# Patient Record
Sex: Male | Born: 1995 | Race: Black or African American | Hispanic: No | Marital: Single | Smoking: Never smoker
Health system: Southern US, Community
[De-identification: ages and names within clinical notes are randomized; demographics above are authoritative.]

---

## 2019-07-22 ENCOUNTER — Ambulatory Visit: Payer: Self-pay | Attending: Internal Medicine

## 2020-05-24 ENCOUNTER — Emergency Department (HOSPITAL_COMMUNITY)
Admission: EM | Admit: 2020-05-24 | Discharge: 2020-05-25 | Disposition: A | Discharge status: Home / Self Care | Payer: Medicaid Other | Attending: Emergency Medicine | Admitting: Emergency Medicine

## 2020-05-24 ENCOUNTER — Encounter (HOSPITAL_COMMUNITY): Payer: Self-pay

## 2020-05-24 ENCOUNTER — Other Ambulatory Visit: Payer: Self-pay

## 2020-05-24 DIAGNOSIS — R1011 Right upper quadrant pain: Secondary | ICD-10-CM | POA: Diagnosis not present

## 2020-05-24 NOTE — ED Triage Notes (Signed)
Pt reports RUQ abdominal pain that started tonight. Pt continues saying the pain is not bad but it made it difficult sleeping. Deniesn/v/d.

## 2020-05-25 ENCOUNTER — Emergency Department (HOSPITAL_COMMUNITY): Payer: Medicaid Other

## 2020-05-25 LAB — CBC WITH DIFFERENTIAL/PLATELET
Abs Immature Granulocytes: 0.02 10*3/uL (ref 0.00–0.07)
Basophils Absolute: 0.1 10*3/uL (ref 0.0–0.1)
Basophils Relative: 1 %
Eosinophils Absolute: 0.2 10*3/uL (ref 0.0–0.5)
Eosinophils Relative: 2 %
HCT: 49.3 % (ref 39.0–52.0)
Hemoglobin: 15.7 g/dL (ref 13.0–17.0)
Immature Granulocytes: 0 %
Lymphocytes Relative: 30 %
Lymphs Abs: 2.6 10*3/uL (ref 0.7–4.0)
MCH: 26.3 pg (ref 26.0–34.0)
MCHC: 31.8 g/dL (ref 30.0–36.0)
MCV: 82.7 fL (ref 80.0–100.0)
Monocytes Absolute: 1 10*3/uL (ref 0.1–1.0)
Monocytes Relative: 11 %
Neutro Abs: 4.7 10*3/uL (ref 1.7–7.7)
Neutrophils Relative %: 56 %
Platelets: 203 10*3/uL (ref 150–400)
RBC: 5.96 MIL/uL — ABNORMAL HIGH (ref 4.22–5.81)
RDW: 13.2 % (ref 11.5–15.5)
WBC: 8.5 10*3/uL (ref 4.0–10.5)
nRBC: 0 % (ref 0.0–0.2)

## 2020-05-25 LAB — COMPREHENSIVE METABOLIC PANEL
ALT: 35 U/L (ref 0–44)
AST: 26 U/L (ref 15–41)
Albumin: 4.5 g/dL (ref 3.5–5.0)
Alkaline Phosphatase: 55 U/L (ref 38–126)
Anion gap: 8 (ref 5–15)
BUN: 9 mg/dL (ref 6–20)
CO2: 28 mmol/L (ref 22–32)
Calcium: 10.1 mg/dL (ref 8.9–10.3)
Chloride: 100 mmol/L (ref 98–111)
Creatinine, Ser: 1.13 mg/dL (ref 0.61–1.24)
GFR, Estimated: >60 mL/min (ref >60)
Glucose, Bld: 99 mg/dL (ref 70–99)
Potassium: 3.8 mmol/L (ref 3.5–5.1)
Sodium: 136 mmol/L (ref 135–145)
Total Bilirubin: 0.6 mg/dL (ref 0.3–1.2)
Total Protein: 8.1 g/dL (ref 6.5–8.1)

## 2020-05-25 LAB — URINALYSIS, ROUTINE W REFLEX MICROSCOPIC
Bilirubin Urine: NEGATIVE
Glucose, UA: NEGATIVE mg/dL
Hgb urine dipstick: NEGATIVE
Ketones, ur: NEGATIVE mg/dL
Leukocytes,Ua: NEGATIVE
Nitrite: NEGATIVE
Protein, ur: NEGATIVE mg/dL
Specific Gravity, Urine: 1.008 (ref 1.005–1.030)
pH: 6 (ref 5.0–8.0)

## 2020-05-25 LAB — LIPASE, BLOOD: Lipase: 22 U/L (ref 11–51)

## 2020-05-25 MED ORDER — POLYETHYLENE GLYCOL 3350 17 GM/SCOOP PO POWD: 17.0000 g | Freq: Every day | ORAL | 0 refills | Status: AC

## 2020-05-25 NOTE — Discharge Instructions (Signed)
We recommend Tylenol or ibuprofen for management of persistent pain.  You have been started on MiraLAX to promote regular bowel movements.  Follow-up with your primary care doctor for further evaluation of your symptoms.  Return if symptoms persist or worsen.

## 2020-05-25 NOTE — ED Provider Notes (Signed)
[facility]-EMERGENCY DEPT Provider Note   CSN: [id number] Arrival date & time: 05/24/20  2345     History Chief Complaint  Patient presents with  . Abdominal Pain    Johnny Orozco is a 25 y.o. male.  25 year old male presents to the emergency department for evaluation of abdominal pain.  Pain has been intermittent over the past 24 hours.  Unable to say when the pain began, but reports that he did not have the pain yesterday.  Pain is in his right upper quadrant.  It is nonradiating.  No known modifying factors.  He has tried some gas tablets and Pepto-Bismol for symptoms without relief.  Had a bowel movement earlier today, but reports history of constipation.  Denies associated fevers, nausea, vomiting, diarrhea, urinary symptoms, history of abdominal surgeries.  The history is provided by the patient. No language interpreter was used.  Abdominal Pain      History reviewed. No pertinent past medical history.  There are no problems to display for this patient.   History reviewed. No pertinent surgical history.     No family history on file.  Social History   Tobacco Use  . Smoking status: Never Smoker  . Smokeless tobacco: Never Used    Home Medications Prior to Admission medications   Medication Sig Start Date End Date Taking? Authorizing Provider  polyethylene glycol powder (GLYCOLAX/MIRALAX) 17 GM/SCOOP powder Take 17 g by mouth daily. Until daily soft stools  OTC 05/25/20  Yes Antony Madura, PA-C    Allergies    Risperidone  Review of Systems   Review of Systems  Gastrointestinal: Positive for abdominal pain.  Ten systems reviewed and are negative for acute change, except as noted in the HPI.    Physical Exam Updated Vital Signs BP (!) 127/115 (BP Location: Left Arm)   Pulse 98   Temp 97.7 F (36.5 C) (Oral)   Resp 16   SpO2 100%   Physical Exam Vitals and nursing note reviewed.  Constitutional:      General: He is not in  acute distress.    Appearance: He is well-developed. He is not diaphoretic.     Comments: Nontoxic appearing and in NAD  HENT:     Head: Normocephalic and atraumatic.  Eyes:     General: No scleral icterus.    Conjunctiva/sclera: Conjunctivae normal.  Cardiovascular:     Rate and Rhythm: Normal rate and regular rhythm.     Pulses: Normal pulses.  Pulmonary:     Effort: Pulmonary effort is normal. No respiratory distress.     Breath sounds: No stridor. No wheezing.     Comments: Respirations even and unlabored Abdominal:     Palpations: There is no mass.     Tenderness: There is no abdominal tenderness. There is no guarding.     Comments: Soft, nondistended, nontender abdomen.  No guarding or peritoneal signs.  Musculoskeletal:        General: Normal range of motion.     Cervical back: Normal range of motion.  Skin:    General: Skin is warm and dry.     Coloration: Skin is not pale.     Findings: No erythema or rash.  Neurological:     Mental Status: He is alert and oriented to person, place, and time.     Coordination: Coordination normal.  Psychiatric:        Behavior: Behavior normal.     ED Results / Procedures / Treatments   Labs (  all labs ordered are listed, but only abnormal results are displayed) Labs Reviewed  CBC WITH DIFFERENTIAL/PLATELET - Abnormal; Notable for the following components:      Result Value   RBC 5.96 (*)    All other components within normal limits  URINALYSIS, ROUTINE W REFLEX MICROSCOPIC - Abnormal; Notable for the following components:   Color, Urine STRAW (*)    All other components within normal limits  COMPREHENSIVE METABOLIC PANEL  LIPASE, BLOOD    EKG None  Radiology DG ABD ACUTE 2+V W 1V CHEST  Result Date: 05/25/2020 CLINICAL DATA:  Right upper quadrant pain EXAM: DG ABDOMEN ACUTE WITH 1 VIEW CHEST COMPARISON:  None. FINDINGS: There is no evidence of dilated bowel loops or free intraperitoneal air. No radiopaque calculi or  other significant radiographic abnormality is seen. Heart size and mediastinal contours are within normal limits. Both lungs are clear. IMPRESSION: Negative abdominal radiographs.  No acute cardiopulmonary disease. Electronically Signed   By: Jonna Clark M.D.   On: 05/25/2020 01:16    Procedures Procedures   Medications Ordered in ED Medications - No data to display  ED Course  I have reviewed the triage vital signs and the nursing notes.  Pertinent labs & imaging results that were available during my care of the patient were reviewed by me and considered in my medical decision making (see chart for details).  Clinical Course as of 05/26/20 6546  Mon May 25, 2020  0128 Labs and Xray negative and without changes to suggest acute or emergent process. [KH]  0217 Patient resting comfortably on repeat exam.  Denies any pain at present.  Repeat abdominal assessment without tenderness, distention, peritoneal signs.  Given reassuring labs and imaging, will proceed with outpatient primary care follow-up. [KH]    Clinical Course User Index [KH] Darylene Price   MDM Rules/Calculators/A&P                          25 year old male presents to the emergency department for evaluation of right upper quadrant abdominal pain.  Despite pain complaints, is asymptomatic during assessment.  Initial and repeat abdominal exam without tenderness to palpation.  Labs and x-ray have been reassuring.  No leukocytosis, fever to suggest infectious etiology.  I have reviewed the patient's x-ray myself which does not suggest underlying constipation; however, patient self reports a history of this.  Will provide course of MiraLAX to promote regular bowel movements.  He has been advised to follow-up with his primary care doctor for further evaluation of ongoing or recurrent pain.  Return precautions discussed and provided. Patient discharged in stable condition with no unaddressed concerns.   Final Clinical  Impression(s) / ED Diagnoses Final diagnoses:  Right upper quadrant abdominal pain    Rx / DC Orders ED Discharge Orders         Ordered    polyethylene glycol powder (GLYCOLAX/MIRALAX) 17 GM/SCOOP powder  Daily        05/25/20 0217           Antony Madura, PA-C 05/26/20 5035    Glynn Octave, MD 05/26/20 [id number]

## 2022-02-24 IMAGING — CR DG ABDOMEN ACUTE W/ 1V CHEST
4 series · 4 of 4 positions shown · non-contrast
Comparison: None.

CLINICAL DATA: Right upper quadrant pain

EXAM:
DG ABDOMEN ACUTE WITH 1 VIEW CHEST

[w chest pa]
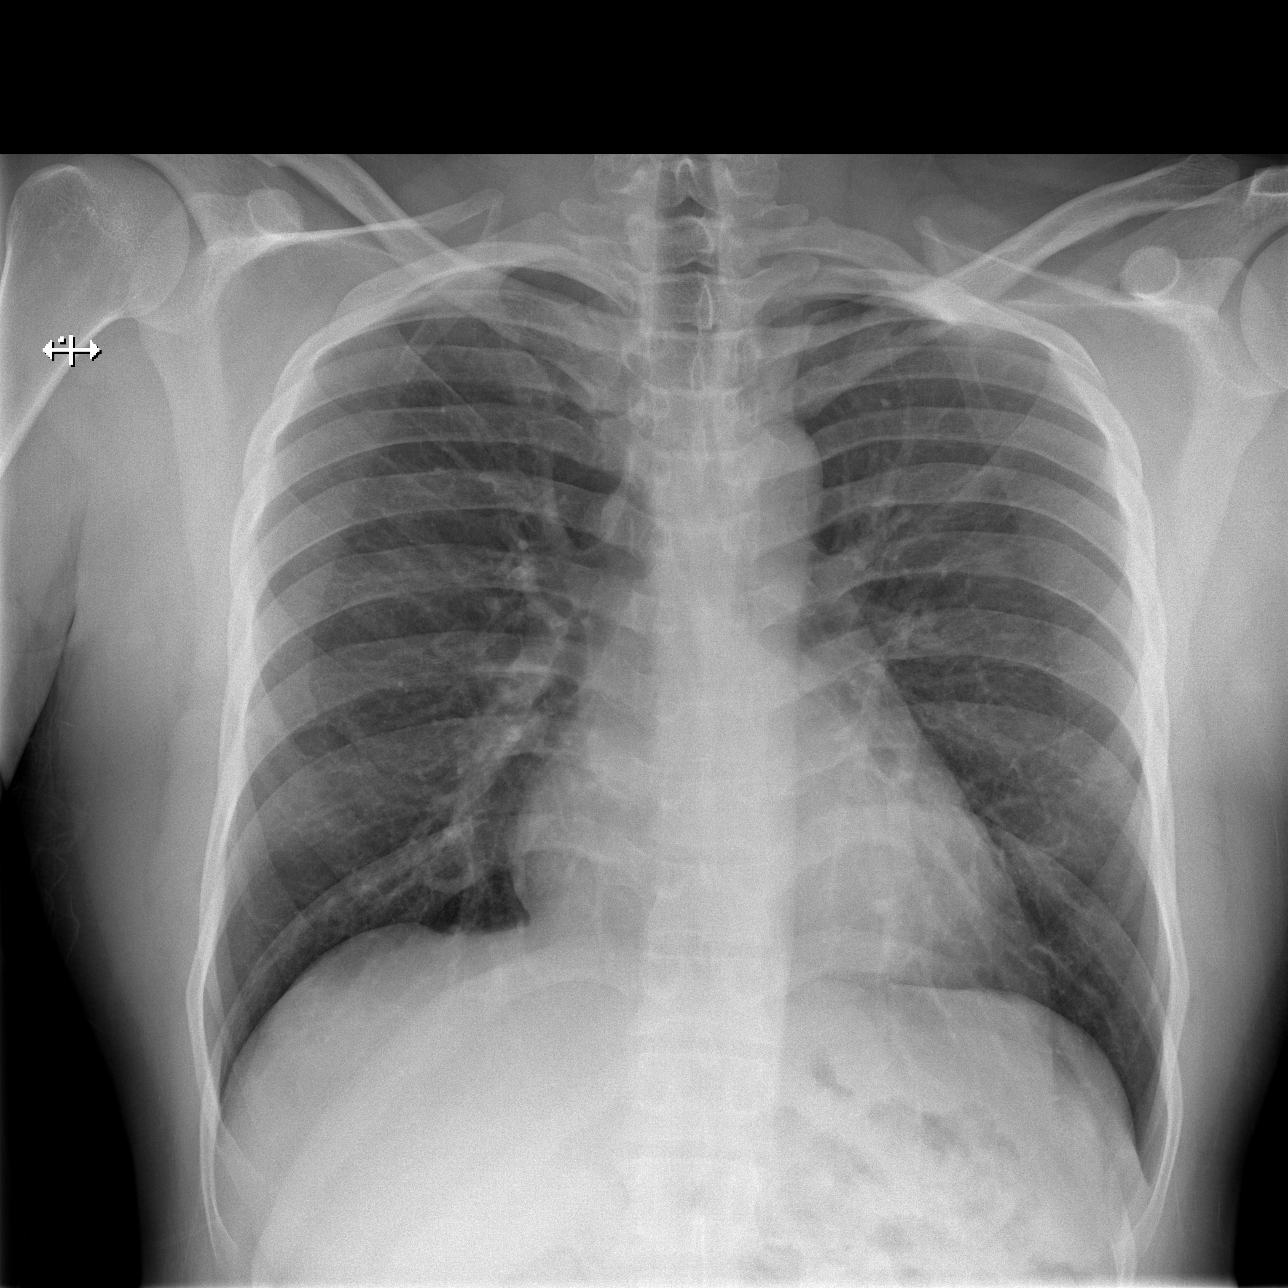

[w abdomen upright]
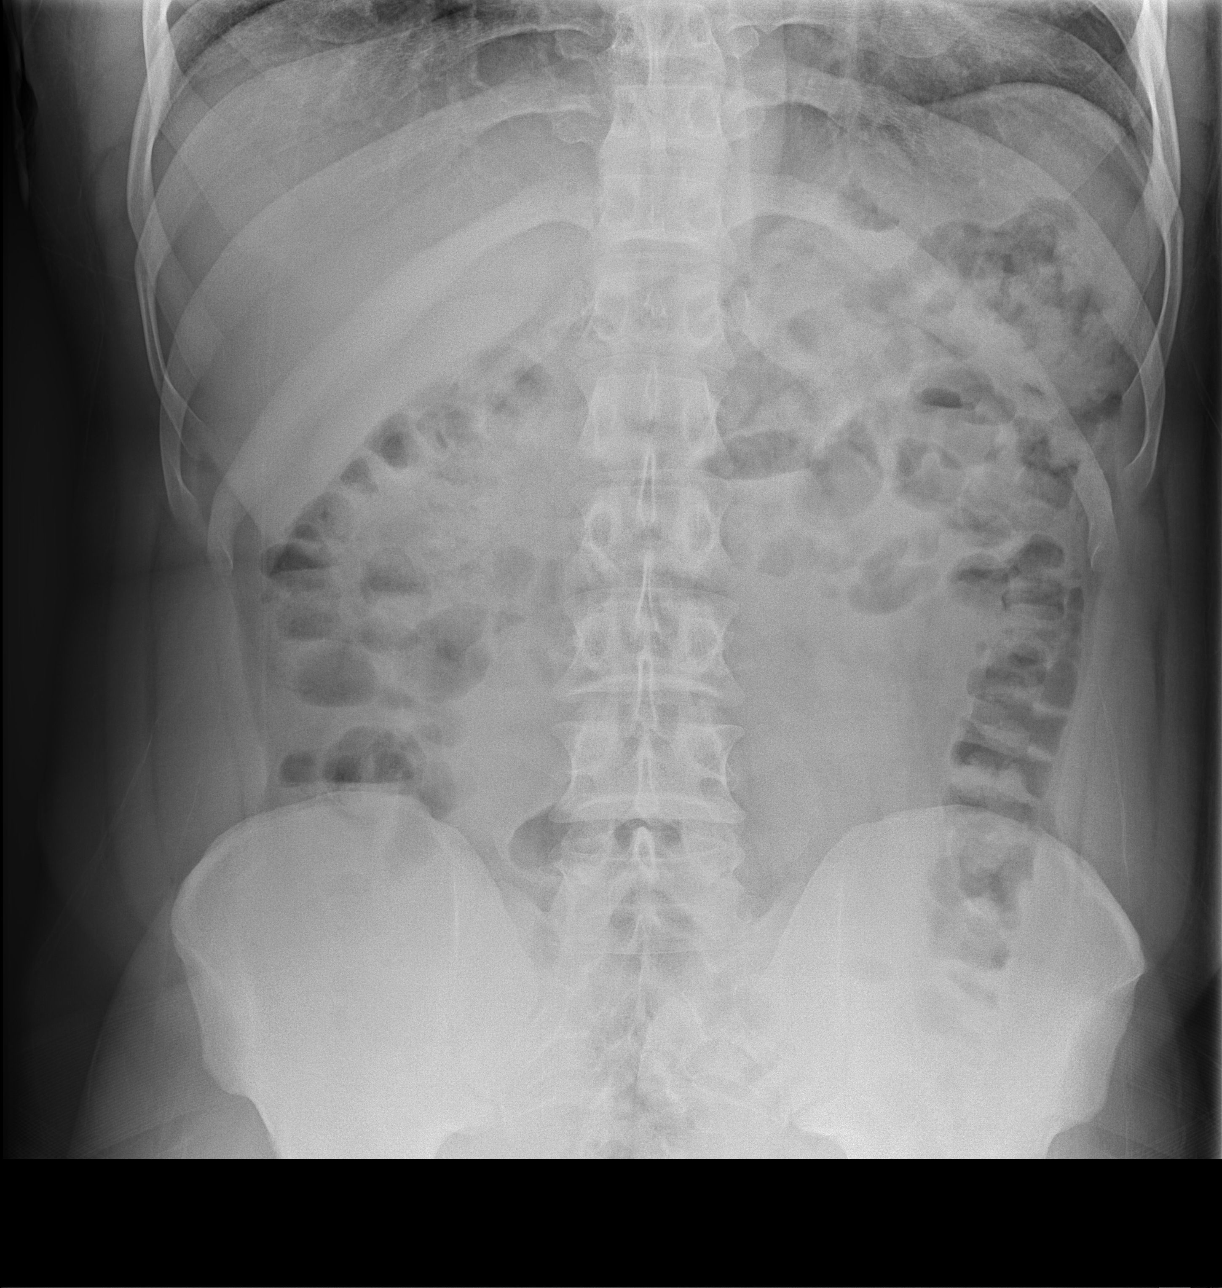

[t abdomen supine (1 of 2)]
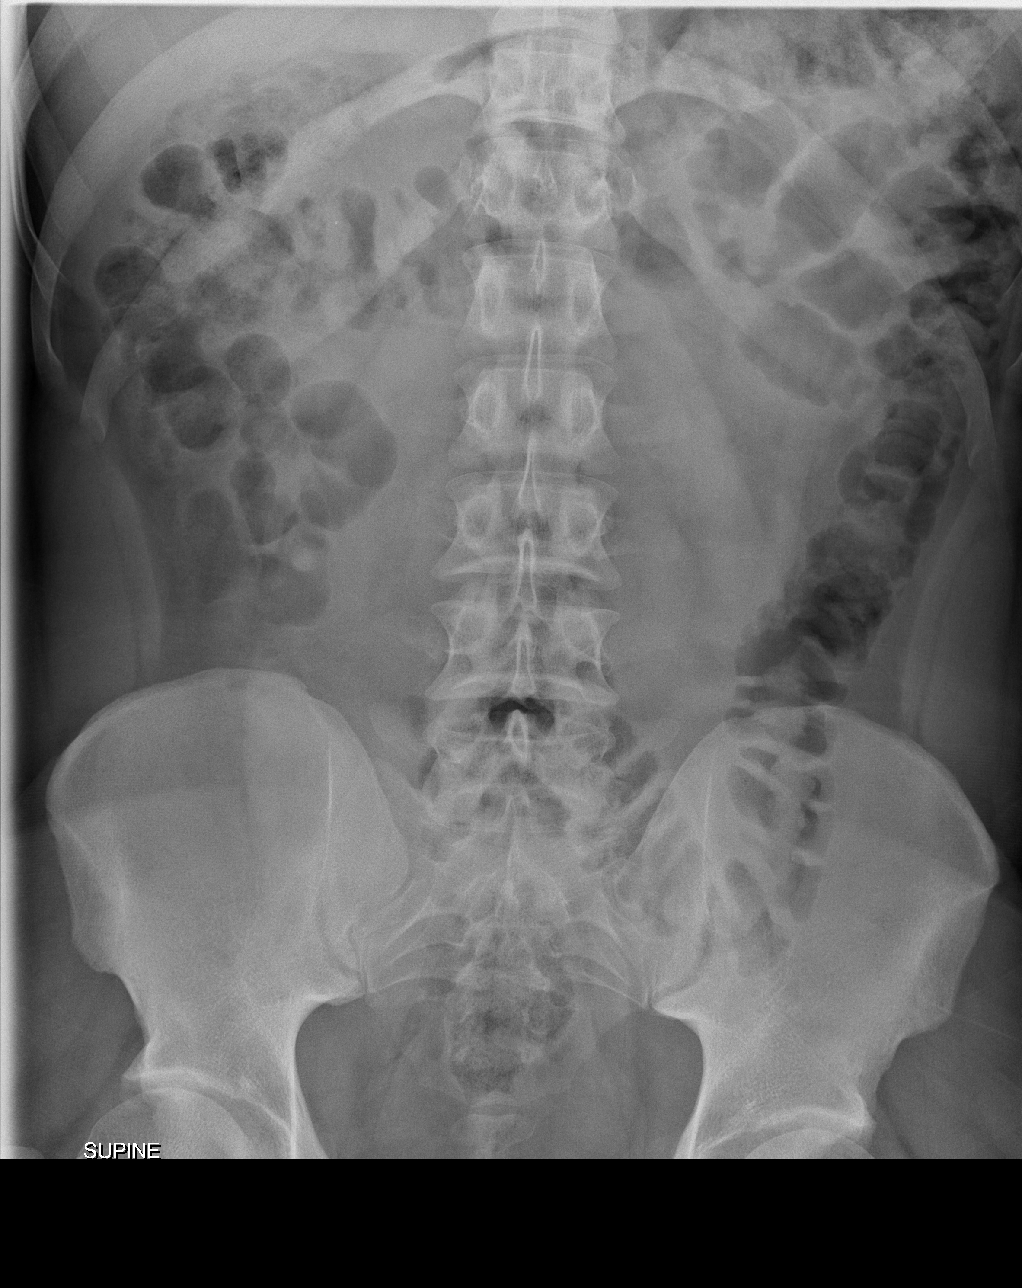

[t abdomen supine (2 of 2)]
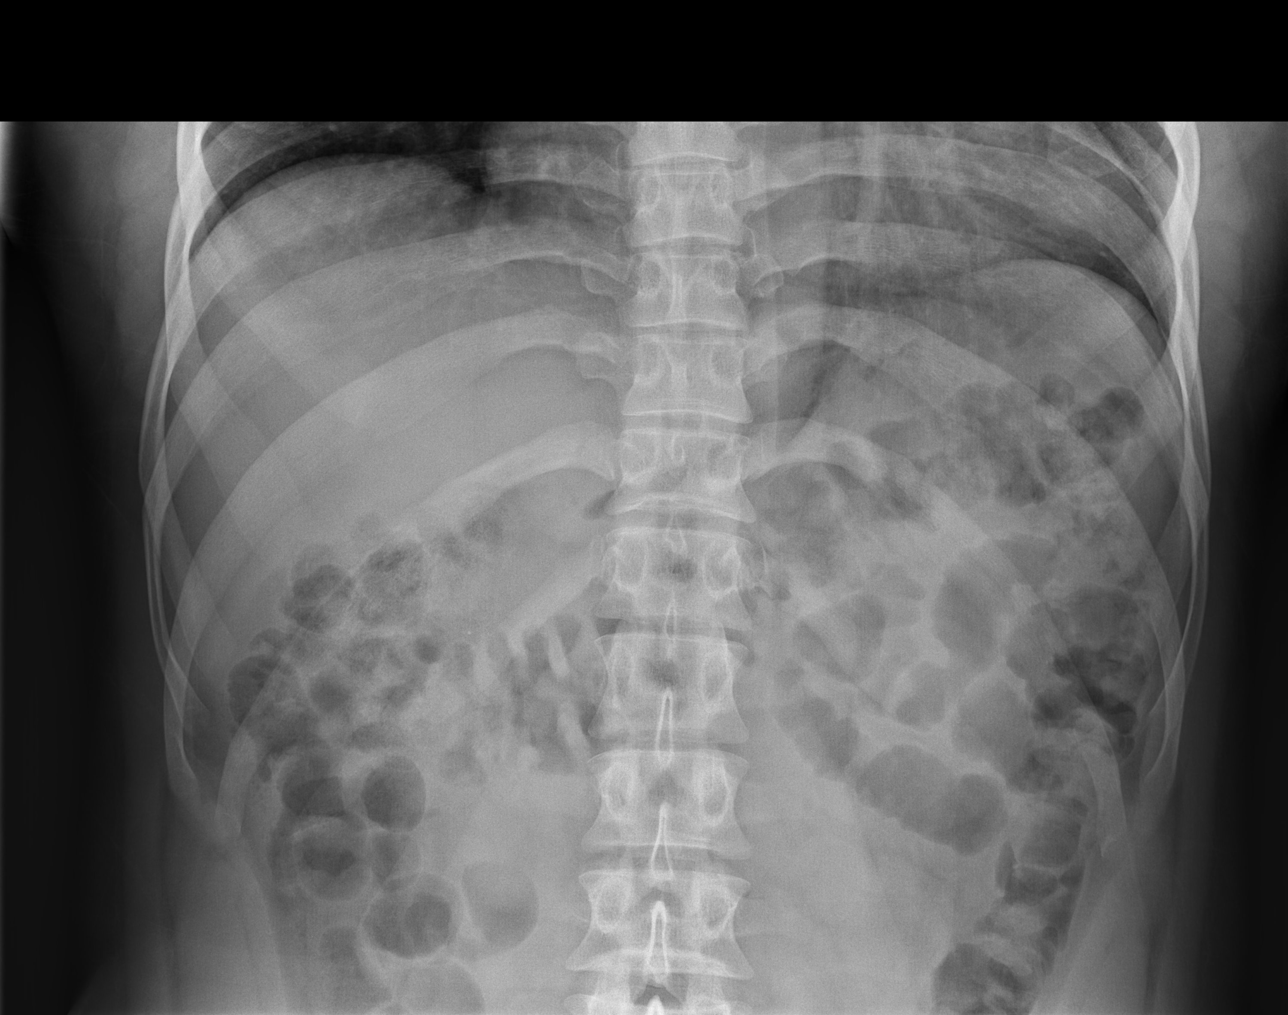

[4 of 4 positions shown; findings below may reference images not displayed]

FINDINGS: There is no evidence of dilated bowel loops or free intraperitoneal
air. No radiopaque calculi or other significant radiographic
abnormality is seen. Heart size and mediastinal contours are within
normal limits. Both lungs are clear.
IMPRESSION: Negative abdominal radiographs.  No acute cardiopulmonary disease.
